# Patient Record
Sex: Male | Born: 1970 | Hispanic: Yes | Marital: Married | Smoking: Current every day smoker
Health system: Southern US, Community
[De-identification: ages and names within clinical notes are randomized; demographics above are authoritative.]

## PROBLEM LIST (undated history)

## (undated) DIAGNOSIS — F909 Attention-deficit hyperactivity disorder, unspecified type: Secondary | ICD-10-CM

## (undated) DIAGNOSIS — Z973 Presence of spectacles and contact lenses: Secondary | ICD-10-CM

## (undated) DIAGNOSIS — I10 Essential (primary) hypertension: Secondary | ICD-10-CM

## (undated) DIAGNOSIS — K219 Gastro-esophageal reflux disease without esophagitis: Secondary | ICD-10-CM

## (undated) HISTORY — DX: Attention-deficit hyperactivity disorder, unspecified type: F90.9

## (undated) HISTORY — DX: Essential (primary) hypertension: I10

## (undated) HISTORY — PX: APPENDECTOMY: SHX54

---

## 2008-11-27 ENCOUNTER — Emergency Department (HOSPITAL_BASED_OUTPATIENT_CLINIC_OR_DEPARTMENT_OTHER): Admission: EM | Admit: 2008-11-27 | Discharge: 2008-11-27 | Payer: Self-pay | Admitting: Emergency Medicine

## 2008-12-02 ENCOUNTER — Inpatient Hospital Stay (HOSPITAL_COMMUNITY): Admission: EM | Admit: 2008-12-02 | Discharge: 2008-12-06 | Payer: Self-pay | Admitting: Internal Medicine

## 2008-12-02 ENCOUNTER — Encounter: Payer: Self-pay | Admitting: Emergency Medicine

## 2008-12-02 ENCOUNTER — Ambulatory Visit: Payer: Self-pay | Admitting: Diagnostic Radiology

## 2009-04-14 ENCOUNTER — Ambulatory Visit: Payer: Self-pay | Admitting: Diagnostic Radiology

## 2009-04-14 ENCOUNTER — Emergency Department (HOSPITAL_BASED_OUTPATIENT_CLINIC_OR_DEPARTMENT_OTHER): Admission: EM | Admit: 2009-04-14 | Discharge: 2009-04-14 | Payer: Self-pay | Admitting: Emergency Medicine

## 2010-02-04 ENCOUNTER — Encounter: Payer: Self-pay | Admitting: Emergency Medicine

## 2010-02-04 ENCOUNTER — Ambulatory Visit: Payer: Self-pay | Admitting: Diagnostic Radiology

## 2010-02-05 ENCOUNTER — Inpatient Hospital Stay (HOSPITAL_COMMUNITY): Admission: EM | Admit: 2010-02-05 | Discharge: 2010-02-09 | Payer: Self-pay

## 2010-06-21 IMAGING — CT CT ABD-PELV W/ CM
2 of 3 series · 16 of 46 positions shown, 18 images · IV contrast (APPLIED)
Comparison: CT 04/14/2009

CLINICAL DATA: Abdominal pain, left lower quadrant pain.  Prior
appendectomy.

CT ABDOMEN AND PELVIS WITH CONTRAST
TECHNIQUE: Multidetector CT imaging of the abdomen and pelvis was
performed following the standard protocol during bolus
administration of intravenous contrast.
Contrast: 100 ml Omnipaque 300

[Series 2: abd/pelvis 5.0 b31f · axial · 0.98mm/px · z∈[-541,-86]mm · 13 of 105 slices shown, 15 images]
[im 7/105  soft-tissue]
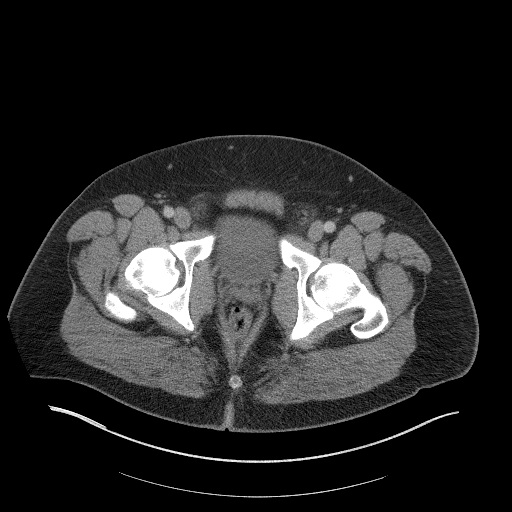
[im 7/105  bone]
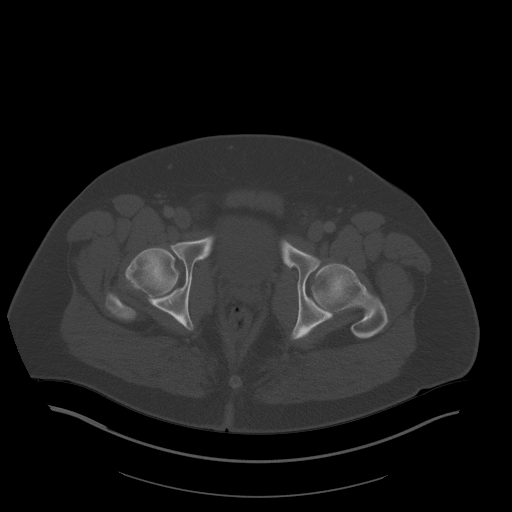
[im 14/105  soft-tissue]
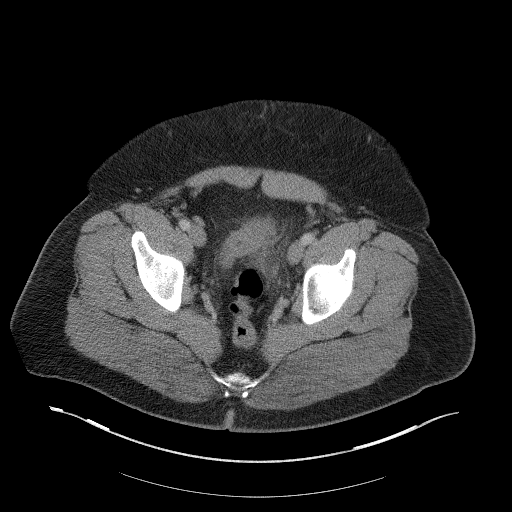
[im 21/105  soft-tissue]
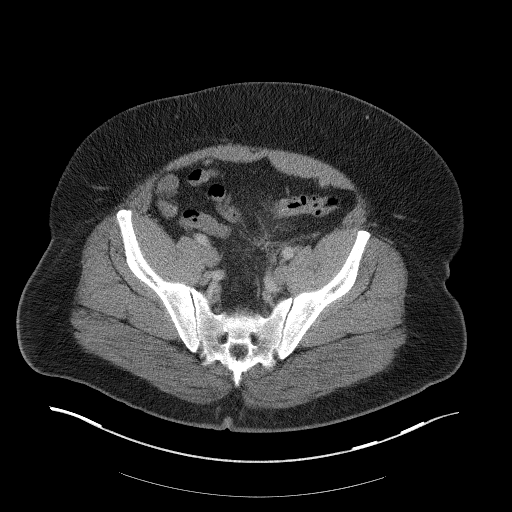
[im 31/105  soft-tissue]
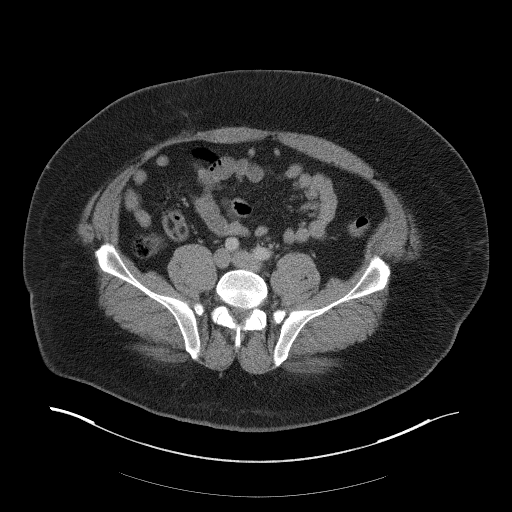
[im 37/105  soft-tissue]
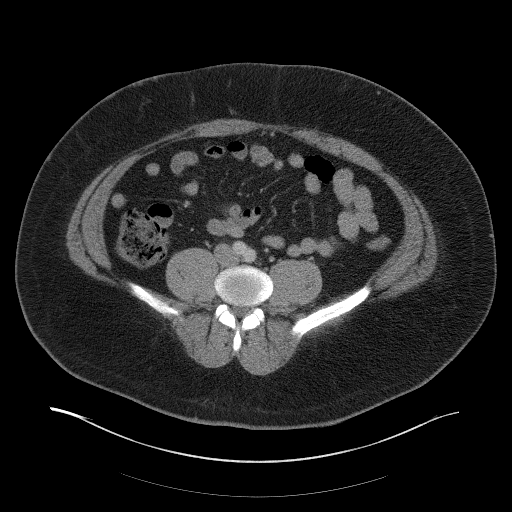
[im 44/105  soft-tissue]
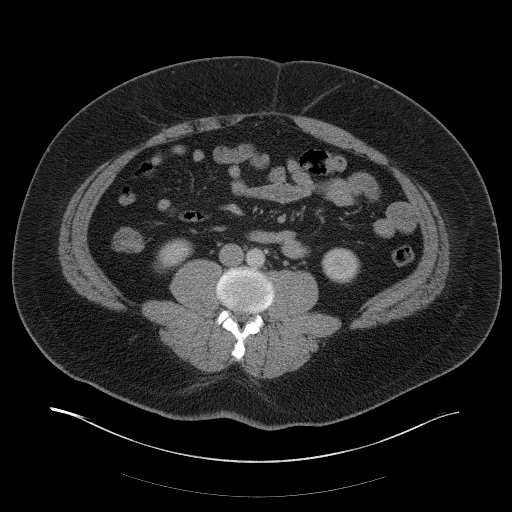
[im 54/105  soft-tissue]
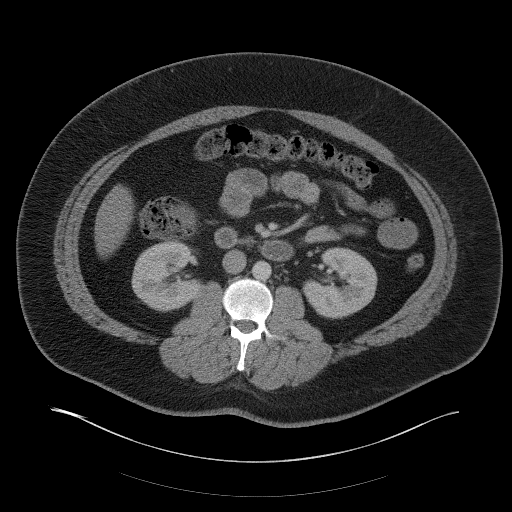
[im 61/105  soft-tissue]
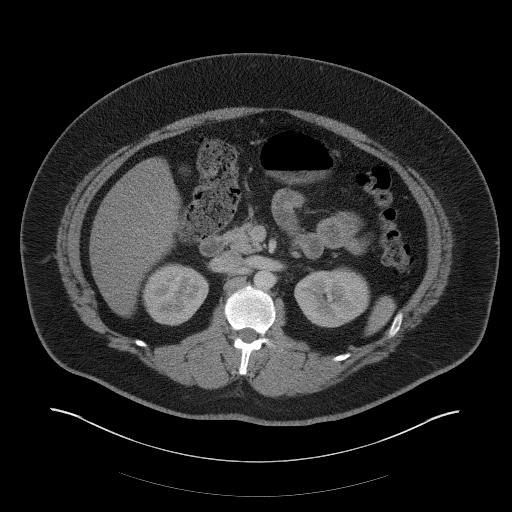
[im 68/105  soft-tissue]
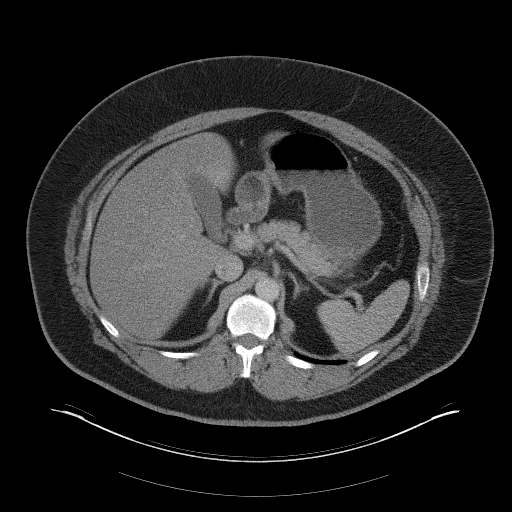
[im 68/105  bone]
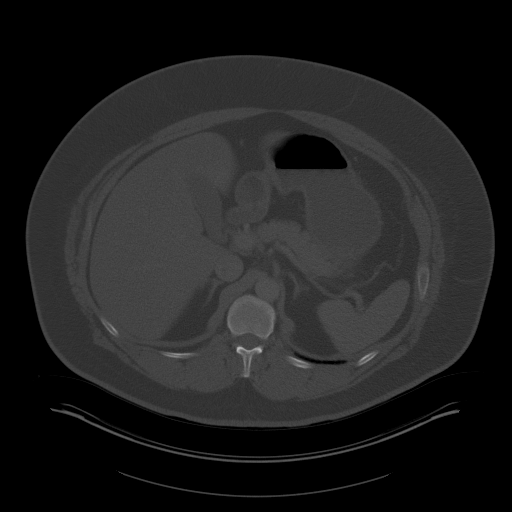
[im 74/105  soft-tissue]
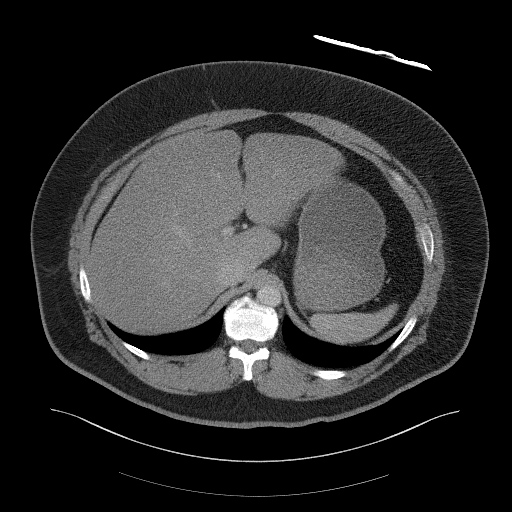
[im 84/105  soft-tissue]
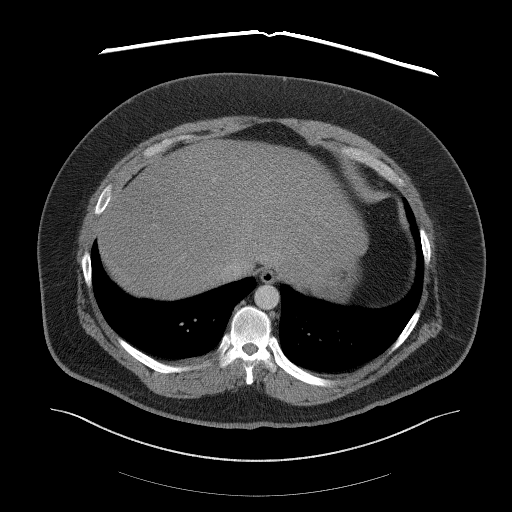
[im 91/105  soft-tissue]
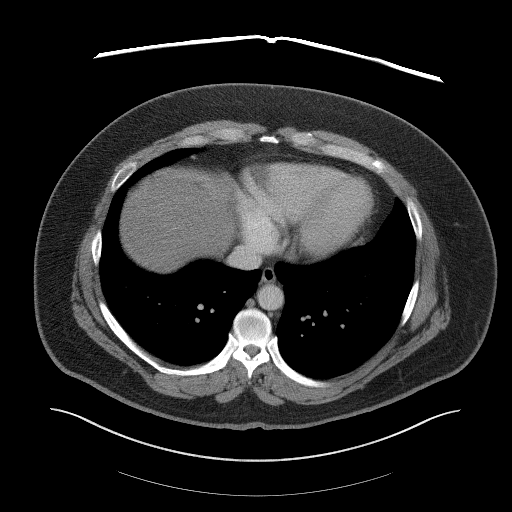
[im 98/105  soft-tissue]
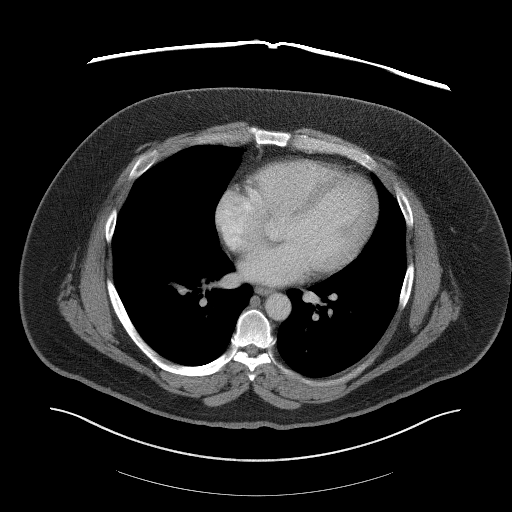

[Series 5: abd/pelvis 3.0 coronal · coronal · 0.95mm/px · 3 of 101 slices shown]
[im 34/101  soft-tissue]
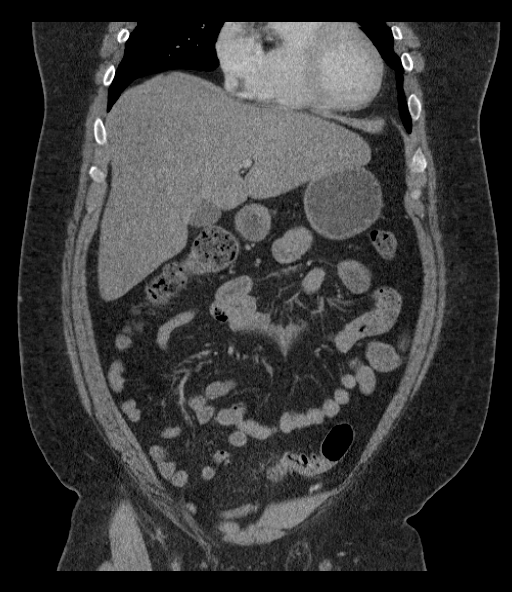
[im 45/101  soft-tissue]
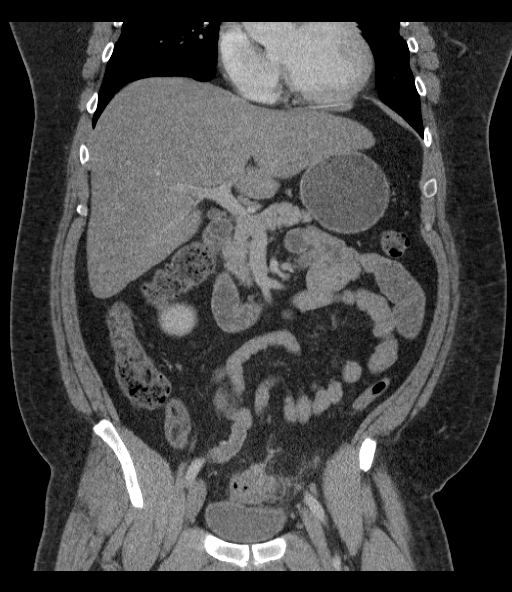
[im 56/101  soft-tissue]
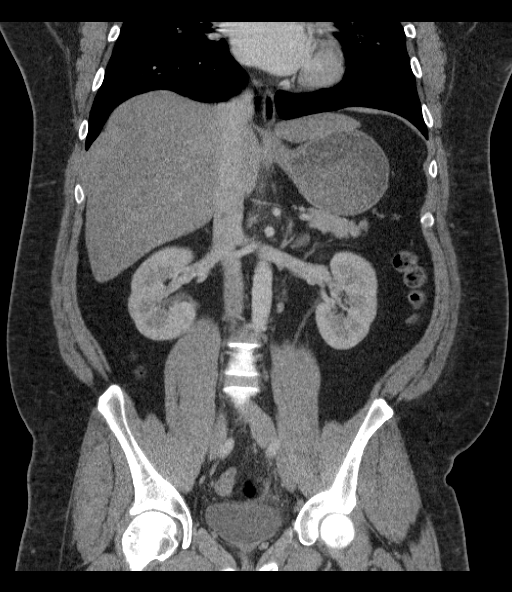

[16 of 46 positions shown; findings below may reference images not displayed]

FINDINGS: There are several small ground-glass nodules within the
right middle lobe which have an infectious or inflammatory pattern.
No pleural fluid.

No focal hepatic lesion.  There is hepatic steatosis.  The
gallbladder, pancreas, spleen, adrenal glands, and kidneys are
normal.

The stomach, duodenum, small bowel, and cecum are normal.  The
ascending and transverse colon are normal.  Beginning in the
proximal sigmoid colon, there is pericolonic fat stranding and a
small amount of fluid in the sigmoid mesocolon.  There are multiple
diverticula through this region and thickened bowel wall (image
89).  There is no evidence of macro perforation or abscess.  The
more distal sigmoid colon and rectum are normal.

Abdominal aorta is normal caliber.  No retroperitoneal
lymphadenopathy.

The bladder and prostate are normal.  No pelvic lymphadenopathy.
There are bilateral 8 mm external iliac lymph nodes present.
IMPRESSION: 1.  Acute sigmoid diverticulitis without evidence of macro
perforation or abscess.
2.  Ground-glass nodules in the right middle lobe are in an
infectious or inflammatory pattern.
3.  Small iliac lymph nodes are likely reactive.

## 2011-02-16 LAB — CBC
HCT: 34.7 % — ABNORMAL LOW (ref 39.0–52.0)
Hemoglobin: 12.9 g/dL — ABNORMAL LOW (ref 13.0–17.0)
Hemoglobin: 12 g/dL — ABNORMAL LOW (ref 13.0–17.0)
Hemoglobin: 12.3 g/dL — ABNORMAL LOW (ref 13.0–17.0)
MCHC: 34.6 g/dL (ref 30.0–36.0)
MCV: 88.5 fL (ref 78.0–100.0)
MCV: 89.4 fL (ref 78.0–100.0)
RBC: 4.19 MIL/uL — ABNORMAL LOW (ref 4.22–5.81)
RBC: 3.97 MIL/uL — ABNORMAL LOW (ref 4.22–5.81)
RDW: 13.4 % (ref 11.5–15.5)
RDW: 14 % (ref 11.5–15.5)
WBC: 10.9 10*3/uL — ABNORMAL HIGH (ref 4.0–10.5)
WBC: 10.1 10*3/uL (ref 4.0–10.5)
WBC: 9.6 10*3/uL (ref 4.0–10.5)

## 2011-02-16 LAB — BASIC METABOLIC PANEL
BUN: 13 mg/dL (ref 6–23)
BUN: 5 mg/dL — ABNORMAL LOW (ref 6–23)
CO2: 26 mEq/L (ref 19–32)
CO2: 27 mEq/L (ref 19–32)
Calcium: 8.3 mg/dL — ABNORMAL LOW (ref 8.4–10.5)
Chloride: 108 mEq/L (ref 96–112)
Chloride: 104 mEq/L (ref 96–112)
Creatinine, Ser: 0.8 mg/dL (ref 0.4–1.5)
Creatinine, Ser: 0.64 mg/dL (ref 0.4–1.5)
GFR calc Af Amer: >60 mL/min (ref >60)
GFR calc Af Amer: >60 mL/min (ref >60)
Glucose, Bld: 109 mg/dL — ABNORMAL HIGH (ref 70–99)
Potassium: 3.8 mEq/L (ref 3.5–5.1)
Sodium: 146 mEq/L — ABNORMAL HIGH (ref 135–145)
Sodium: 133 mEq/L — ABNORMAL LOW (ref 135–145)
Sodium: 135 mEq/L (ref 135–145)

## 2011-02-16 LAB — COMPREHENSIVE METABOLIC PANEL
AST: 21 U/L (ref 0–37)
Albumin: 3.1 g/dL — ABNORMAL LOW (ref 3.5–5.2)
Alkaline Phosphatase: 64 U/L (ref 39–117)
BUN: 9 mg/dL (ref 6–23)
Calcium: 8 mg/dL — ABNORMAL LOW (ref 8.4–10.5)
Creatinine, Ser: 0.72 mg/dL (ref 0.4–1.5)
GFR calc Af Amer: >60 mL/min (ref >60)
Glucose, Bld: 118 mg/dL — ABNORMAL HIGH (ref 70–99)
Total Bilirubin: 1 mg/dL (ref 0.3–1.2)
Total Protein: 6.1 g/dL (ref 6.0–8.3)

## 2011-02-16 LAB — DIFFERENTIAL
Basophils Absolute: 0.1 10*3/uL (ref 0.0–0.1)
Basophils Relative: 1 % (ref 0–1)
Eosinophils Absolute: 0.2 10*3/uL (ref 0.0–0.7)
Eosinophils Relative: 1 % (ref 0–5)
Lymphocytes Relative: 34 % (ref 12–46)
Monocytes Absolute: 1 10*3/uL (ref 0.1–1.0)

## 2011-02-16 LAB — HEMOGLOBIN A1C
Hgb A1c MFr Bld: 6.1 % (ref 4.6–6.1)
Mean Plasma Glucose: 128 mg/dL

## 2011-02-16 LAB — GLUCOSE, CAPILLARY
Glucose-Capillary: 104 mg/dL — ABNORMAL HIGH (ref 70–99)
Glucose-Capillary: 116 mg/dL — ABNORMAL HIGH (ref 70–99)
Glucose-Capillary: 170 mg/dL — ABNORMAL HIGH (ref 70–99)
Glucose-Capillary: 84 mg/dL (ref 70–99)
Glucose-Capillary: 93 mg/dL (ref 70–99)

## 2011-02-16 LAB — URINE CULTURE: Colony Count: 2000

## 2011-03-03 LAB — URINALYSIS, ROUTINE W REFLEX MICROSCOPIC
Bilirubin Urine: NEGATIVE
Glucose, UA: NEGATIVE mg/dL
Hgb urine dipstick: NEGATIVE
Ketones, ur: NEGATIVE mg/dL
Nitrite: NEGATIVE
Protein, ur: NEGATIVE mg/dL
Specific Gravity, Urine: 1.025 (ref 1.005–1.030)
Urobilinogen, UA: 1 mg/dL (ref 0.0–1.0)
pH: 6 (ref 5.0–8.0)

## 2011-03-03 LAB — DIFFERENTIAL
Basophils Absolute: 0.1 10*3/uL (ref 0.0–0.1)
Basophils Relative: 1 % (ref 0–1)
Eosinophils Absolute: 0.1 10*3/uL (ref 0.0–0.7)
Eosinophils Relative: 1 % (ref 0–5)
Lymphocytes Relative: 30 % (ref 12–46)
Lymphs Abs: 2.9 10*3/uL (ref 0.7–4.0)
Monocytes Absolute: 0.5 10*3/uL (ref 0.1–1.0)
Monocytes Relative: 5 % (ref 3–12)
Neutro Abs: 5.9 10*3/uL (ref 1.7–7.7)
Neutrophils Relative %: 63 % (ref 43–77)

## 2011-03-03 LAB — COMPREHENSIVE METABOLIC PANEL
ALT: 24 U/L (ref 0–53)
AST: 23 U/L (ref 0–37)
Albumin: 4.3 g/dL (ref 3.5–5.2)
Alkaline Phosphatase: 111 U/L (ref 39–117)
BUN: 15 mg/dL (ref 6–23)
CO2: 27 mEq/L (ref 19–32)
Calcium: 9.1 mg/dL (ref 8.4–10.5)
Chloride: 103 mEq/L (ref 96–112)
Creatinine, Ser: 0.7 mg/dL (ref 0.4–1.5)
GFR calc Af Amer: >60 mL/min (ref >60)
GFR calc non Af Amer: >60 mL/min (ref >60)
Glucose, Bld: 95 mg/dL (ref 70–99)
Potassium: 4 mEq/L (ref 3.5–5.1)
Sodium: 142 mEq/L (ref 135–145)
Total Bilirubin: 0.8 mg/dL (ref 0.3–1.2)
Total Protein: 8.2 g/dL (ref 6.0–8.3)

## 2011-03-03 LAB — CBC
HCT: 43.9 % (ref 39.0–52.0)
Hemoglobin: 15 g/dL (ref 13.0–17.0)
MCHC: 34.2 g/dL (ref 30.0–36.0)
MCV: 87 fL (ref 78.0–100.0)
Platelets: 219 10*3/uL (ref 150–400)
RBC: 5.05 MIL/uL (ref 4.22–5.81)
RDW: 13.1 % (ref 11.5–15.5)
WBC: 9.5 10*3/uL (ref 4.0–10.5)

## 2011-03-03 LAB — LIPASE, BLOOD: Lipase: 48 U/L (ref 23–300)

## 2011-03-09 LAB — BASIC METABOLIC PANEL
CO2: 25 mEq/L (ref 19–32)
CO2: 27 mEq/L (ref 19–32)
Calcium: 8.2 mg/dL — ABNORMAL LOW (ref 8.4–10.5)
Calcium: 8.6 mg/dL (ref 8.4–10.5)
Chloride: 103 mEq/L (ref 96–112)
Chloride: 105 mEq/L (ref 96–112)
Creatinine, Ser: 0.77 mg/dL (ref 0.4–1.5)
GFR calc Af Amer: >60 mL/min (ref >60)
GFR calc Af Amer: >60 mL/min (ref >60)
GFR calc Af Amer: >60 mL/min (ref >60)
GFR calc non Af Amer: >60 mL/min (ref >60)
Glucose, Bld: 108 mg/dL — ABNORMAL HIGH (ref 70–99)
Glucose, Bld: 125 mg/dL — ABNORMAL HIGH (ref 70–99)
Potassium: 3.8 mEq/L (ref 3.5–5.1)
Sodium: 135 mEq/L (ref 135–145)
Sodium: 137 mEq/L (ref 135–145)
Sodium: 138 mEq/L (ref 135–145)

## 2011-03-09 LAB — LIPID PANEL
Cholesterol: 130 mg/dL (ref 0–200)
HDL: 17 mg/dL — ABNORMAL LOW (ref >39)
Total CHOL/HDL Ratio: 7.6 RATIO
VLDL: 22 mg/dL (ref 0–40)

## 2011-03-09 LAB — URINE MICROSCOPIC-ADD ON

## 2011-03-09 LAB — URINALYSIS, ROUTINE W REFLEX MICROSCOPIC
Ketones, ur: 15 mg/dL — AB
Nitrite: NEGATIVE
Specific Gravity, Urine: 1.034 — ABNORMAL HIGH (ref 1.005–1.030)
Specific Gravity, Urine: 1.035 — ABNORMAL HIGH (ref 1.005–1.030)
Urobilinogen, UA: 1 mg/dL (ref 0.0–1.0)

## 2011-03-09 LAB — CBC
HCT: 35.9 % — ABNORMAL LOW (ref 39.0–52.0)
HCT: 36.7 % — ABNORMAL LOW (ref 39.0–52.0)
HCT: 37.8 % — ABNORMAL LOW (ref 39.0–52.0)
HCT: 39.3 % (ref 39.0–52.0)
Hemoglobin: 12.1 g/dL — ABNORMAL LOW (ref 13.0–17.0)
Hemoglobin: 12.6 g/dL — ABNORMAL LOW (ref 13.0–17.0)
Hemoglobin: 12.8 g/dL — ABNORMAL LOW (ref 13.0–17.0)
Hemoglobin: 12.8 g/dL — ABNORMAL LOW (ref 13.0–17.0)
MCHC: 33.9 g/dL (ref 30.0–36.0)
MCV: 88.6 fL (ref 78.0–100.0)
MCV: 88.6 fL (ref 78.0–100.0)
MCV: 89.1 fL (ref 78.0–100.0)
Platelets: 327 10*3/uL (ref 150–400)
Platelets: 360 10*3/uL (ref 150–400)
RBC: 4.03 MIL/uL — ABNORMAL LOW (ref 4.22–5.81)
RBC: 4.14 MIL/uL — ABNORMAL LOW (ref 4.22–5.81)
RBC: 4.3 MIL/uL (ref 4.22–5.81)
RDW: 13.3 % (ref 11.5–15.5)
RDW: 13.7 % (ref 11.5–15.5)
WBC: 10 10*3/uL (ref 4.0–10.5)
WBC: 13.1 10*3/uL — ABNORMAL HIGH (ref 4.0–10.5)
WBC: 14.4 10*3/uL — ABNORMAL HIGH (ref 4.0–10.5)

## 2011-03-09 LAB — COMPREHENSIVE METABOLIC PANEL
ALT: 38 U/L (ref 0–53)
ALT: 60 U/L — ABNORMAL HIGH (ref 0–53)
Albumin: 3.9 g/dL (ref 3.5–5.2)
BUN: 13 mg/dL (ref 6–23)
BUN: 6 mg/dL (ref 6–23)
CO2: 24 mEq/L (ref 19–32)
CO2: 24 mEq/L (ref 19–32)
CO2: 27 mEq/L (ref 19–32)
Calcium: 7.9 mg/dL — ABNORMAL LOW (ref 8.4–10.5)
Calcium: 8.5 mg/dL (ref 8.4–10.5)
Chloride: 101 mEq/L (ref 96–112)
Chloride: 102 mEq/L (ref 96–112)
Creatinine, Ser: 0.8 mg/dL (ref 0.4–1.5)
GFR calc non Af Amer: >60 mL/min (ref >60)
GFR calc non Af Amer: >60 mL/min (ref >60)
GFR calc non Af Amer: >60 mL/min (ref >60)
Glucose, Bld: 112 mg/dL — ABNORMAL HIGH (ref 70–99)
Glucose, Bld: 116 mg/dL — ABNORMAL HIGH (ref 70–99)
Sodium: 134 mEq/L — ABNORMAL LOW (ref 135–145)
Sodium: 137 mEq/L (ref 135–145)
Total Bilirubin: 0.6 mg/dL (ref 0.3–1.2)
Total Bilirubin: 0.7 mg/dL (ref 0.3–1.2)
Total Protein: 6.9 g/dL (ref 6.0–8.3)

## 2011-03-09 LAB — CULTURE, BLOOD (ROUTINE X 2): Culture: NO GROWTH

## 2011-03-09 LAB — HEMOGLOBIN A1C: Hgb A1c MFr Bld: 6.2 % — ABNORMAL HIGH (ref 4.6–6.1)

## 2011-03-09 LAB — LIPASE, BLOOD: Lipase: 36 U/L (ref 23–300)

## 2011-03-09 LAB — DIFFERENTIAL
Basophils Absolute: 0.1 10*3/uL (ref 0.0–0.1)
Lymphocytes Relative: 10 % — ABNORMAL LOW (ref 12–46)
Neutro Abs: 9.9 10*3/uL — ABNORMAL HIGH (ref 1.7–7.7)
Neutrophils Relative %: 76 % (ref 43–77)

## 2011-03-09 LAB — URINE CULTURE

## 2011-04-07 NOTE — Discharge Summary (Signed)
NAME:  Mitchell Jacobson, TABET NO.:  [id number]   MEDICAL RECORD NO.:  [id number]          PATIENT TYPE:  INP   LOCATION:  1512                         FACILITY:  [facility]   PHYSICIAN:  Hillery Aldo, M.D.   DATE OF BIRTH:  12/10/1970   DATE OF ADMISSION:  12/02/2008  DATE OF DISCHARGE:  12/06/2008                               DISCHARGE SUMMARY   PRIMARY CARE PHYSICIAN:  None.  The patient will be referred to Dr.  Della Goo for hospital follow-up.   DISCHARGE DIAGNOSES:  1. Diverticulitis with microabscess formation.  2. Dehydration.  3. Impaired fasting glucose.  4. Obesity.  5. Fatty liver.   DISCHARGE MEDICATIONS:  1. Cipro 500 mg twice daily times 10 days.  2. Flagyl 500 mg three times daily times 10 days.  3. Percocet 5/325 one to two tabs q.6 h p.r.n. pain.  4. Ibuprofen 800 mg q.8 h p.r.n. pain.  5. Tylenol PM 1 tablet nightly p.r.n.   CONSULTATIONS:  None.   BRIEF ADMISSION HPI:  The patient is a 40 year old male who presented to  the hospital with a chief complaint of colicky type abdominal pain and  intermittent diarrhea.  He presented to the urgent care facility where a  CAT scan was performed and he was found to have diverticulitis with  microabscess formation.  The patient was subsequently referred to the  hospitalist service for direct admission.  For the full details, please  see the dictated report done by Dr. Orvan Falconer.   PROCEDURES AND DIAGNOSTIC STUDIES:  1. CT scan of the abdomen and pelvis on December 02, 2008, showed      moderate sigmoid diverticulitis with several small diverticular      abscesses in the adjacent sigmoid mesocolon.  2. Follow-up CT scan of the abdomen and pelvis on December 05, 2008,      showed no significant change in the sigmoid diverticulitis with      adjacent pericolonic abscess.   DISCHARGE LABORATORY VALUES:  Sodium was 138, potassium 3.9, chloride  105, bicarbonate 25, BUN 8, creatinine 0.76, glucose 108.   White blood  cell count was 8.2, hemoglobin 12.8, hematocrit 37.8, platelets 360.   HOSPITAL COURSE BY PROBLEM:  1. Diverticulitis with microabscess formation:  Patient was admitted      and empirically put on Zosyn and bowel rest.  Dr. Gerrit Friends, of      general surgery, was curb-sided and recommended conservative      therapy with surgical consultation if the patient deteriorates.      The patient's clinical course was one of steady improvement.  His      white blood cell count reached a high of 14.4 and has reverted to      normal.  On December 04, 2008, the patient was converted to Cipro      and Flagyl and has tolerated this therapy well.  He has been      afebrile and his abdominal pain has improved.  A follow-up CT scan      did not show any worsening of his abscesses.  At this point,  the      patient will continue on Cipro and Flagyl for an additional 10 days      of therapy.  A hospital follow-up appointment has been made with      Dr. Lovell Sheehan on December 13, 2008, at 2:15 p.m.  The patient is      instructed to take all of his antibiotics as prescribed and to      return to the emergency department if he experiences any worsening      of abdominal pain, recurrent fever, or hematochezia.  2. Dehydration:  The patient was put on IV fluids and kept on IV      fluids until he was taking adequate p.o.'s.  3. Impaired fasting glucose:  The patient did have impaired fasting      glucose levels.  A hemoglobin A1c was checked and found to be 6.2%,      which corresponds to a mean plasma glucose of 131.  The patient was      advised to make dietary changes and to avoid concentrated sweets      and starch to help with his glycemic control.  4. Obesity/fatty liver:  The patient was advised to eat a heart-      healthy diet.  He should follow up with his primary care physician      for further treatment if necessary.  A fasting lipid profile did      not reveal any evidence of dyslipidemia.   His total cholesterol was      130, HDL 17, LDL 91, and triglycerides 952.   Time spent coordinating care for discharge and discharge instructions  equals 35 minutes.      Hillery Aldo, M.D.  Electronically Signed     CR/MEDQ  D:  12/06/2008  T:  12/06/2008  Job:  [id number]   cc:   Della Goo, M.D.  Fax: [phone]

## 2011-04-07 NOTE — H&P (Signed)
NAME:  Mitchell Jacobson, Mitchell Jacobson NO.:  [id number]   MEDICAL RECORD NO.:  [id number]          PATIENT TYPE:  INP   LOCATION:  1512                         FACILITY:  [facility]   PHYSICIAN:  Vania Rea, M.D. DATE OF BIRTH:  1970/11/24   DATE OF ADMISSION:  12/02/2008  DATE OF DISCHARGE:                              HISTORY & PHYSICAL   PRIMARY CARE PHYSICIAN:  Unassigned.   CHIEF COMPLAINT:  Abdominal pain.   HISTORY OF PRESENT ILLNESS:  This is a 40 year old Hispanic gentleman  who was well until 6 days ago when he started having colicky abdominal  pain and dysuria.  He presented to [facility]  where he was diagnosed with a urinary tract infection and discharged  home with Cipro and Percocet.  The patient says his symptoms improved  somewhat and he was able to discontinue taking Percocet but noticed that  he was having severe colicky abdominal pains with defecation or passing  gas.  Eventually he returned to the urgent care facility yesterday where  a CAT scan was performed and he was found to have diverticulitis with  abscess formation.  The hospitalist service was called to assist with  management.   The patient says fevers have mostly subsided.  However, he continues to  have this severe colicky abdominal pains.  He has been taking Tylenol  and ibuprofen.   He is having no chest pains, no shortness of breath and no dizziness.  He does not describe bloody stool.   PAST MEDICAL HISTORY:  History of appendicitis with postoperative wound  dehiscence.   MEDICATIONS:  Tylenol p.r.n., ibuprofen p.r.n., recently completed a  course of Cipro.   ALLERGIES:  No known drug allergies.   SOCIAL HISTORY:  Smokes 5 cigarettes per day.  Drinks about 3 beers per  month.  Denies illicit drug use.  Works in [employer].   FAMILY HISTORY:  Significant for a father with coronary artery disease,  hypertension and glaucoma.   REVIEW OF SYSTEMS:  Other  than noted above a 10-point review of systems  is unremarkable.   PHYSICAL EXAMINATION:  Obese young Hispanic gentleman lying in bed in no  acute distress.  VITALS:  Temperature is 99.8, pulse 85, respiration 20, blood pressure  120/75.  He is saturating 100% on room air.  His pupils are round and equal.  Mucous membranes pink.  Anicteric.  He  has no cervical lymphadenopathy or thyromegaly.  No jugular venous  distention.  He has a very thick neck.  CHEST:  Clear to auscultation bilaterally.  CARDIOVASCULAR:  Regular rhythm without murmur.  ABDOMEN:  Obese but mostly soft.  He is tender in the left quadrants and  hypogastrium.  EXTREMITIES:  Without edema.  He has 2+ pulses bilaterally.  CENTRAL NERVOUS SYSTEM: Cranial nerves II-XII are grossly intact and has  no focal neurologic deficit.   LABORATORY DATA:  White count is 13.1, hemoglobin 13.1, platelets 127.  Absolute granulocyte count is elevated at 9.9.  His serum chemistry is  significant only for slightly elevated glucose of 137, otherwise his  chemistry is  mostly unremarkable.  AST, ALT and alk phos show mildly  elevated AST of 48, ALT 84 and alk phos 124.  Urinalysis has a specific  gravity is 1.035, total protein is 100, it is otherwise unremarkable.  CT scan of the abdomen shows mild diffuse fatty infiltration of the  liver.  CT scan of the pelvis shows moderate sigmoid diverticulitis with  several small diverticular abscesses in the adjacent sigmoid mesocolon,  the largest being 2-3 cm.   ASSESSMENT:  1. Acute diverticulitis  with abscess formation.  2. Dehydration  3. Hyperglycemia.  4. Obesity.  5. Fatty infiltration of the liver secondary to obesity.  6. Abnormal liver function tests related to fatty liver.   PLAN:  Will admit this gentleman for IV fluids and antibiotics therapy  with Zosyn.  Will avoid NSAIDs at the time being for pain control in  case he needs to proceed to surgery.  Will check a hemoglobin A1c  and  also give advise on diet.  Other plans as per orders.      Vania Rea, M.D.  Electronically Signed     LC/MEDQ  D:  12/02/2008  T:  12/02/2008  Job:  [id number]

## 2011-06-01 ENCOUNTER — Encounter: Payer: Self-pay | Admitting: *Deleted

## 2011-06-01 DIAGNOSIS — J111 Influenza due to unidentified influenza virus with other respiratory manifestations: Secondary | ICD-10-CM | POA: Insufficient documentation

## 2011-06-01 NOTE — ED Notes (Signed)
Per pt wife pt began feeling poorly on 7/4 pt with cough sore throat HA body aches fever and congestion pt was dx with a virus pt presents to the ED with worsening flu like sx

## 2011-06-02 ENCOUNTER — Emergency Department (HOSPITAL_BASED_OUTPATIENT_CLINIC_OR_DEPARTMENT_OTHER)
Admission: EM | Admit: 2011-06-02 | Discharge: 2011-06-02 | Discharge status: Against Medical Advice | Payer: Self-pay | Attending: Emergency Medicine | Admitting: Emergency Medicine

## 2011-06-02 ENCOUNTER — Emergency Department (HOSPITAL_BASED_OUTPATIENT_CLINIC_OR_DEPARTMENT_OTHER): Admit: 2011-06-02 | Payer: Self-pay

## 2011-06-02 HISTORY — DX: Gastro-esophageal reflux disease without esophagitis: K21.9

## 2011-06-02 MED ORDER — ACETAMINOPHEN 325 MG PO TABS: 650.0000 mg | ORAL_TABLET | Freq: Once | ORAL | Status: DC

## 2011-06-02 MED ORDER — ACETAMINOPHEN 325 MG PO TABS
ORAL_TABLET | ORAL | Status: AC
  Filled 2011-06-02: qty 2

## 2011-06-02 NOTE — ED Notes (Signed)
Attempted to administer medication. radiology called pt for x ray and pt was not in the waiting area

## 2011-09-17 ENCOUNTER — Emergency Department (HOSPITAL_COMMUNITY)
Admission: EM | Admit: 2011-09-17 | Discharge: 2011-09-17 | Disposition: A | Discharge status: Home / Self Care | Payer: Self-pay | Attending: Emergency Medicine | Admitting: Emergency Medicine

## 2011-09-17 DIAGNOSIS — IMO0001 Reserved for inherently not codable concepts without codable children: Secondary | ICD-10-CM | POA: Insufficient documentation

## 2011-09-17 DIAGNOSIS — K117 Disturbances of salivary secretion: Secondary | ICD-10-CM | POA: Insufficient documentation

## 2011-09-17 DIAGNOSIS — B9789 Other viral agents as the cause of diseases classified elsewhere: Secondary | ICD-10-CM | POA: Insufficient documentation

## 2011-09-17 DIAGNOSIS — R6883 Chills (without fever): Secondary | ICD-10-CM | POA: Insufficient documentation

## 2011-09-17 DIAGNOSIS — I10 Essential (primary) hypertension: Secondary | ICD-10-CM | POA: Insufficient documentation

## 2011-09-17 DIAGNOSIS — R3919 Other difficulties with micturition: Secondary | ICD-10-CM | POA: Insufficient documentation

## 2011-09-17 LAB — POCT I-STAT, CHEM 8
BUN: 22 mg/dL (ref 6–23)
Calcium, Ion: 1.12 mmol/L (ref 1.12–1.32)
Glucose, Bld: 111 mg/dL — ABNORMAL HIGH (ref 70–99)
HCT: 46 % (ref 39.0–52.0)
TCO2: 24 mmol/L (ref 0–100)

## 2014-09-21 ENCOUNTER — Ambulatory Visit (INDEPENDENT_AMBULATORY_CARE_PROVIDER_SITE_OTHER): Payer: Self-pay | Admitting: Family Medicine

## 2014-09-21 VITALS — HR 72 | Temp 98.7°F | Resp 18 | Ht 71.0 in | Wt 234.0 lb

## 2014-09-21 DIAGNOSIS — S52502D Unspecified fracture of the lower end of left radius, subsequent encounter for closed fracture with routine healing: Secondary | ICD-10-CM

## 2014-09-21 DIAGNOSIS — S022XXD Fracture of nasal bones, subsequent encounter for fracture with routine healing: Secondary | ICD-10-CM

## 2014-09-21 DIAGNOSIS — M79629 Pain in unspecified upper arm: Secondary | ICD-10-CM

## 2014-09-21 DIAGNOSIS — S52511A Displaced fracture of right radial styloid process, initial encounter for closed fracture: Secondary | ICD-10-CM

## 2014-09-21 MED ORDER — OXYCODONE-ACETAMINOPHEN 10-325 MG PO TABS: ORAL_TABLET | ORAL | Status: DC

## 2014-09-21 NOTE — Progress Notes (Signed)
Subjective: 43 year old man who was working in [place] and helping someone out. He was on a scaffold which fell. It was about 8 feet high. He tried to leap from the falling scaffolding, but another layer of it fell on him and trapped him. He fell breaking both arms. The left was in the mid to upper forearm, the right in the more distal forearm and wrist area. They took him to surgery and put casts on both arms. He was told to follow-up with a orthopedist in that area, but he came on back to [place]. He was not given any copies of the x-rays. He was given some Percocet, strength unknown, but is not given him sufficient pain release.  Objective: Both arms are casted. The left hand is more swollen than the right with minimal movement of his fingers. The right has good movement of fingers. He also probably broke his nose. He had a lot of bleeding from both nares. He has an abrasion on the bridge of the nose. Both nares appear open and no blood nail.  Assessment: Bilateral forearm fracture Probable nose fracture Abrasion nose  Plan: Chronic to get records from [facility]  Finally the reports. They sent the x-ray report but not the procedure reports. It looks like a intra-articular fracture of the left distal radius and a radial styloid fracture of the right wrist.  Pain medication Refer to ortho--spoke to [facility] who will need this note before they give him Elevate left arm Ice

## 2014-09-21 NOTE — Patient Instructions (Addendum)
Take ibuprofen 800 mg 3 times daily(200 mg x 4)  Percocet 10 one every 4 hours as needed for severe pain  Return if needed  Elevate as directed and apply ice outside the cast for 5 times daily  Appointment is being made at [facility]ist for you to get this followed up on.

## 2014-09-25 ENCOUNTER — Telehealth: Payer: Self-pay

## 2014-09-25 DIAGNOSIS — S42301A Unspecified fracture of shaft of humerus, right arm, initial encounter for closed fracture: Secondary | ICD-10-CM

## 2014-09-25 DIAGNOSIS — S42302A Unspecified fracture of shaft of humerus, left arm, initial encounter for closed fracture: Secondary | ICD-10-CM

## 2014-09-25 NOTE — Telephone Encounter (Signed)
Pt is wanting to talk with dr hopper to let him know that arm is still not better and would like a referral to ortho

## 2014-09-25 NOTE — Telephone Encounter (Signed)
Per OV referral was to be ordered. Placed referral as this was not completed. Pt advised we are working on this referral.

## 2014-09-28 ENCOUNTER — Ambulatory Visit (INDEPENDENT_AMBULATORY_CARE_PROVIDER_SITE_OTHER): Payer: Self-pay | Admitting: Family Medicine

## 2014-09-28 VITALS — BP 132/86 | HR 62 | Temp 98.4°F | Resp 16 | Ht 69.75 in | Wt 232.8 lb

## 2014-09-28 DIAGNOSIS — J3489 Other specified disorders of nose and nasal sinuses: Secondary | ICD-10-CM

## 2014-09-28 DIAGNOSIS — R220 Localized swelling, mass and lump, head: Secondary | ICD-10-CM

## 2014-09-28 DIAGNOSIS — S52502D Unspecified fracture of the lower end of left radius, subsequent encounter for closed fracture with routine healing: Secondary | ICD-10-CM

## 2014-09-28 DIAGNOSIS — M79629 Pain in unspecified upper arm: Secondary | ICD-10-CM

## 2014-09-28 MED ORDER — OXYCODONE-ACETAMINOPHEN 10-325 MG PO TABS: 1.0000 | ORAL_TABLET | Freq: Four times a day (QID) | ORAL | Status: AC | PRN

## 2014-09-28 NOTE — Progress Notes (Addendum)
Subjective:  Authored by Silas SacramentoJeff Alegandra Sommers, MD - unable to change in [facility].   Patient ID: Mitchell Jacobson, male    DOB: [birth year], [age] y.o.   MRN: [id number]  Chief Complaint  Patient presents with  . Follow-up  . Medication Refill    Percocet  10/325 mg   There are no active problems to display for this patient.  Past Medical History  Diagnosis Date  . GERD (gastroesophageal reflux disease)   . 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 Hypertension    Past Surgical History  Procedure Laterality Date  . Appendectomy     No Known Allergies Prior to Admission medications   Medication Sig Start Date End Date Taking? Authorizing Provider  amphetamine-dextroamphetamine (ADDERALL) 20 MG tablet Take 20 mg by mouth daily.   Yes Historical Provider, MD  atenolol (TENORMIN) 25 MG tablet Take 25 mg by mouth daily.   Yes Historical Provider, MD  lisinopril-hydrochlorothiazide (PRINZIDE,ZESTORETIC) 20-25 MG per tablet Take 1 tablet by mouth daily.   Yes Historical Provider, MD  oxyCODONE-acetaminophen (PERCOCET) 10-325 MG per tablet Take one every 4 hours as needed for severe pain 09/21/14  Yes Peyton Najjaravid H Hopper, MD  omeprazole (PRILOSEC) 10 MG capsule Take 10 mg by mouth daily.      Historical Provider, MD   History   Social History  . Marital Status: Married    Spouse Name: N/A    Number of Children: N/A  . Years of Education: N/A   Occupational History  . Not on file.   Social History Main Topics  . Smoking status: Current Every Day Smoker -- 0.50 packs/day    Types: Cigarettes  . Smokeless tobacco: Not on file  . Alcohol Use: No  . Drug Use: No  . Sexual Activity: Not on file   Other Topics Concern  . Not on file   Social History Narrative   HPI  Mitchell Jacobson is a 43 y.o. male  PCP: No PCP Per Patient  Patient is here for follow up; last seen by Dr. Alwyn RenHopper on 09/21/14 after falling from a scaffold. Noted to have broken bones in both arms. Apparently had  surgery and casts placed on both arms with plan to follow up with orthopedist (scheduled for 11/12, 6 days from today) but initially was in [place]. Per last note, bilateral forearm fracture including an intra-articular fracture of the left distal radius and a radial styloid fracture of the right distal radius. He was prescribed 20 of Percocet and referred to [facility].   HPI Comments:   On 09/17/14, patient fell from a scaffold and sustained bilateral forearm fractures. Patient notes that he has been taking 2-3 percocet a day for pain; this relieves his discomfort. He also takes ibuprofen; 2-3 200mg s at a time.   He also reports pain in his nose; he also notes that blowing his nose results in blood-tinged mucous. He is unsure if he had an xray of the nose at the time of his fall.   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   Review of Systems     Objective:   Physical Exam  Constitutional: He is oriented to person, place, and time. He appears well-developed and well-nourished.  HENT:  Head: Normocephalic and atraumatic.  Nose; nasal swelling, more prominent on the right side of the nasal bridge compared to left; small healing abrasion on distal nasal bridge; no septal hematoma; sinuses are nontender and there is no discharge from the nose; TTP  along right aspect of nasal bone  Neck: No tracheal deviation present.  Cardiovascular: Normal rate.   Pulmonary/Chest: Effort normal.  Musculoskeletal:  Left arm splint is in place; sugar tong splint in place on left arm; fingers are all warm; NVI distally but some soft tissue swelling into the hand   Right wrist; decreased ROM, soft tissue swelling, TTP over ulnar styloid, slight tenderness along distal radius, NVI distally to the fingertips; wearing a nonthumb velcro brace   Neurological: He is alert and oriented to person, place, and time.  Skin: Skin is warm and dry.  Psychiatric: He has a normal mood and affect. His behavior is  normal.  Nursing note and vitals reviewed.   Filed Vitals:   09/28/14 1054  BP: 132/86  Pulse: 62  Temp: 98.4 F (36.9 C)  TempSrc: Oral  Resp: 16  Height: 5' 9.75" (1.772 m)  Weight: 232 lb 12.8 oz (105.597 kg)  SpO2: 100%      Assessment & Plan:   Mitchell Jacobson is a 43 y.o. male Pain of upper arm, unspecified laterality - Plan: oxyCODONE-acetaminophen (PERCOCET) 10-325 MG per tablet, Distal radial fracture, left, closed, with routine healing, subsequent encounter  - by prior notes, L distal radius fracture and R radial styloid fx, but may be ulnar styloid on R based on exam. Ortho eval next week. Cont ibuprofen up to 600mg  Q6h, percocet if needed - SED, #20.  rtc precautions.  Note for work.   Nasal pain, Swelling of nose  - suspected nasal bone fx. Deferred XR today, but if still asymmetric appearance next week - can call and I will refer him to ENT for eval. Understanding expressed.    Meds ordered this encounter  Medications  . oxyCODONE-acetaminophen (PERCOCET) 10-325 MG per tablet    Sig: Take 1 tablet by mouth every 6 (six) hours as needed for pain. Take one every 4 hours as needed for severe pain    Dispense:  20 tablet    Refill:  0   Patient Instructions  Keep follow up with orthopaedist next week and splints on areas until seen there. Avoid lifting or use of these wrists as much as possible until that evaluation. Percocet if needed, but start with ibuprofen 600mg  every 6 hours with food.   If your nose is still sore and swollen next week, especially if it still looks more swollen on one side - call me and I can refer you for evaluation with Ear Nose and Throat specialist.   Return to the clinic or go to the nearest emergency room if any of your symptoms worsen or new symptoms occur.      I personally performed the services described in this documentation, which was scribed in my presence. The recorded information has been reviewed and considered, and addended  by me as needed.

## 2014-09-28 NOTE — Patient Instructions (Signed)
Keep follow up with orthopaedist next week and splints on areas until seen there. Avoid lifting or use of these wrists as much as possible until that evaluation. Percocet if needed, but start with ibuprofen 600mg  every 6 hours with food.   If your nose is still sore and swollen next week, especially if it still looks more swollen on one side - call me and I can refer you for evaluation with Ear Nose and Throat specialist.   Return to the clinic or go to the nearest emergency room if any of your symptoms worsen or new symptoms occur.

## 2014-10-03 NOTE — Telephone Encounter (Signed)
Referral was made.

## 2014-10-04 ENCOUNTER — Encounter (HOSPITAL_BASED_OUTPATIENT_CLINIC_OR_DEPARTMENT_OTHER): Payer: Self-pay | Admitting: *Deleted

## 2014-10-04 ENCOUNTER — Inpatient Hospital Stay (HOSPITAL_BASED_OUTPATIENT_CLINIC_OR_DEPARTMENT_OTHER): Admission: RE | Admit: 2014-10-04 | Payer: Self-pay | Source: Ambulatory Visit

## 2014-10-04 ENCOUNTER — Other Ambulatory Visit: Payer: Self-pay

## 2014-10-04 ENCOUNTER — Encounter (HOSPITAL_BASED_OUTPATIENT_CLINIC_OR_DEPARTMENT_OTHER)
Admission: RE | Admit: 2014-10-04 | Discharge: 2014-10-04 | Disposition: A | Discharge status: Home / Self Care | Payer: Self-pay | Source: Ambulatory Visit | Attending: Orthopedic Surgery | Admitting: Orthopedic Surgery

## 2014-10-04 ENCOUNTER — Other Ambulatory Visit: Payer: Self-pay | Admitting: Orthopedic Surgery

## 2014-10-04 DIAGNOSIS — Z01818 Encounter for other preprocedural examination: Secondary | ICD-10-CM | POA: Insufficient documentation

## 2014-10-04 LAB — BASIC METABOLIC PANEL
ANION GAP: 13 (ref 5–15)
BUN: 14 mg/dL (ref 6–23)
CHLORIDE: 101 meq/L (ref 96–112)
CO2: 25 meq/L (ref 19–32)
Calcium: 9.4 mg/dL (ref 8.4–10.5)
Creatinine, Ser: 0.74 mg/dL (ref 0.50–1.35)
GFR calc non Af Amer: >90 mL/min (ref >90)
Glucose, Bld: 105 mg/dL — ABNORMAL HIGH (ref 70–99)
POTASSIUM: 3.7 meq/L (ref 3.7–5.3)
Sodium: 139 mEq/L (ref 137–147)

## 2014-10-04 NOTE — H&P (Signed)
Mitchell Jacobson is an 43 y.o. male.   CC / Reason for Visit: Bilateral upper extremity injuries HPI: This patient is a 43 year old male who presents for evaluation of injuries involving both hands.  He was helping a friend [place] work in [place], when he fell from scaffolding about 8 feet.  It is my understanding that he had sugar tong splints applied, and was to followup with an orthopedist in that area, Dr. Tery SanfilippoNapoli.  Because he lives in this area, he presented to [facility] who recognized his problem and made referral to the appropriate hand surgeon on-call for the [facility], Dr. Amanda PeaGramig.  Patient reports that he try to schedule a followup appointment with [facility], and was told that he could not be seen because he had no insurance.  Subsequently, the patient called our office and presents today for further evaluation and management.  Past Medical History  Diagnosis Date  . GERD (gastroesophageal reflux disease)   . 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 Hypertension   . Wears glasses     Past Surgical History  Procedure Laterality Date  . Appendectomy      Family History  Problem Relation Age of Onset  . Diabetes Mother   . Heart disease Father   . Hyperlipidemia Father   . Hypertension Father   . Heart disease Paternal Grandmother    Social History:  reports that he has been smoking Cigarettes.  He has been smoking about 0.50 packs per day. He does not have any smokeless tobacco history on file. He reports that he drinks alcohol. He reports that he does not use illicit drugs.  Allergies: No Known Allergies  No prescriptions prior to admission    No results found for this or any previous visit (from the past 48 hour(s)). No results found.  Review of Systems  All other systems reviewed and are negative.   Height 5\' 10"  (1.778 m), weight 105.235 kg (232 lb). Physical Exam  Constitutional:  WD, WN,  NAD HEENT:  NCAT, EOMI Neuro/Psych:  Alert & oriented to person, place, and time; appropriate mood & affect Lymphatic: No generalized UE edema or lymphadenopathy Extremities / MSK:  Both UE are normal with respect to appearance, ranges of motion, joint stability, muscle strength/tone, sensation, & perfusion except as otherwise noted:  Left upper extremity in a sugar tong splint, no ulcerations at the margin.  Digits are warm with brisk capillary refill, no neurological deficits detected.  The right side, there some mild tenderness globally at the dorsum of the wrist  Labs / Xrays:  Complete x-ray series of both right and left wrist ordered and obtained today reveals on the left side, needed displaced distal radius fracture, with intra-articular incongruity and 75% dorsal translation, 40-50 dorsal angulation.  On the right side, may be a small nondisplaced fracture through the base of the ulnar styloid.  Assessment: 1.  Right wrist injury, no further treatment required 2.  Left comminuted displaced intra-articular distal radius fracture  Plan:  At discussed these findings with him and recommended surgical treatment for his right distal radius.  Plastic models were used to describe the problem, incorporating the x-rays as well.  An example of the plate was also demonstrated for him on a saw-bones model.    The details of the operative procedure were discussed with the patient.  Questions were invited and answered.  In addition to the goal of the procedure, the  risks of the procedure to include but not limited to bleeding; infection; damage to the nerves or blood vessels that could result in bleeding, numbness, weakness, chronic pain, and the need for additional procedures; stiffness; the need for revision surgery; and anesthetic risks, were reviewed.  No specific outcome was guaranteed or implied.  Informed consent was obtained.  Pain medicines were also prescribed.  Kree Rafter A. 10/04/2014,  2:58 PM

## 2014-10-04 NOTE — Progress Notes (Signed)
Pt coming in for ekg-bmet-fx both wrists

## 2014-10-05 ENCOUNTER — Ambulatory Visit (HOSPITAL_BASED_OUTPATIENT_CLINIC_OR_DEPARTMENT_OTHER)
Admission: RE | Admit: 2014-10-05 | Discharge: 2014-10-05 | Disposition: A | Discharge status: Home / Self Care | Payer: Self-pay | Source: Ambulatory Visit | Attending: Orthopedic Surgery | Admitting: Orthopedic Surgery

## 2014-10-05 ENCOUNTER — Encounter (HOSPITAL_BASED_OUTPATIENT_CLINIC_OR_DEPARTMENT_OTHER)
Admission: RE | Disposition: A | Discharge status: Home / Self Care | Payer: Self-pay | Source: Ambulatory Visit | Attending: Orthopedic Surgery

## 2014-10-05 ENCOUNTER — Ambulatory Visit (HOSPITAL_BASED_OUTPATIENT_CLINIC_OR_DEPARTMENT_OTHER): Payer: Self-pay | Admitting: Anesthesiology

## 2014-10-05 ENCOUNTER — Ambulatory Visit (HOSPITAL_COMMUNITY): Payer: Self-pay

## 2014-10-05 ENCOUNTER — Encounter (HOSPITAL_BASED_OUTPATIENT_CLINIC_OR_DEPARTMENT_OTHER): Payer: Self-pay | Admitting: *Deleted

## 2014-10-05 DIAGNOSIS — Y9389 Activity, other specified: Secondary | ICD-10-CM | POA: Insufficient documentation

## 2014-10-05 DIAGNOSIS — I10 Essential (primary) hypertension: Secondary | ICD-10-CM | POA: Insufficient documentation

## 2014-10-05 DIAGNOSIS — F909 Attention-deficit hyperactivity disorder, unspecified type: Secondary | ICD-10-CM | POA: Insufficient documentation

## 2014-10-05 DIAGNOSIS — Y9289 Other specified places as the place of occurrence of the external cause: Secondary | ICD-10-CM | POA: Insufficient documentation

## 2014-10-05 DIAGNOSIS — W12XXXA Fall on and from scaffolding, initial encounter: Secondary | ICD-10-CM | POA: Insufficient documentation

## 2014-10-05 DIAGNOSIS — Z419 Encounter for procedure for purposes other than remedying health state, unspecified: Secondary | ICD-10-CM

## 2014-10-05 DIAGNOSIS — S52572A Other intraarticular fracture of lower end of left radius, initial encounter for closed fracture: Secondary | ICD-10-CM | POA: Insufficient documentation

## 2014-10-05 DIAGNOSIS — F17210 Nicotine dependence, cigarettes, uncomplicated: Secondary | ICD-10-CM | POA: Insufficient documentation

## 2014-10-05 DIAGNOSIS — K219 Gastro-esophageal reflux disease without esophagitis: Secondary | ICD-10-CM | POA: Insufficient documentation

## 2014-10-05 DIAGNOSIS — Y998 Other external cause status: Secondary | ICD-10-CM | POA: Insufficient documentation

## 2014-10-05 HISTORY — PX: OPEN REDUCTION INTERNAL FIXATION (ORIF) DISTAL RADIAL FRACTURE: SHX5989

## 2014-10-05 HISTORY — DX: Presence of spectacles and contact lenses: Z97.3

## 2014-10-05 LAB — POCT HEMOGLOBIN-HEMACUE: Hemoglobin: 14.6 g/dL (ref 13.0–17.0)

## 2014-10-05 SURGERY — OPEN REDUCTION INTERNAL FIXATION (ORIF) DISTAL RADIUS FRACTURE
Anesthesia: Regional | Site: Wrist | Laterality: Left

## 2014-10-05 MED ORDER — OXYCODONE HCL 5 MG/5ML PO SOLN: 5.0000 mg | Freq: Once | ORAL | Status: DC | PRN

## 2014-10-05 MED ORDER — LACTATED RINGERS IV SOLN
INTRAVENOUS | Status: DC | PRN
  Administered 2014-10-05 (×2): via INTRAVENOUS

## 2014-10-05 MED ORDER — PROPOFOL 10 MG/ML IV BOLUS
INTRAVENOUS | Status: AC
  Filled 2014-10-05: qty 20

## 2014-10-05 MED ORDER — MIDAZOLAM HCL 2 MG/2ML IJ SOLN
INTRAMUSCULAR | Status: AC
  Filled 2014-10-05: qty 2

## 2014-10-05 MED ORDER — MIDAZOLAM HCL 2 MG/2ML IJ SOLN
1.0000 mg | INTRAMUSCULAR | Status: DC | PRN
  Administered 2014-10-05: 2 mg via INTRAVENOUS

## 2014-10-05 MED ORDER — CLINDAMYCIN PHOSPHATE 900 MG/50ML IV SOLN
INTRAVENOUS | Status: AC
  Filled 2014-10-05: qty 50

## 2014-10-05 MED ORDER — PROPOFOL 10 MG/ML IV BOLUS
INTRAVENOUS | Status: DC | PRN
  Administered 2014-10-05: 200 mg via INTRAVENOUS

## 2014-10-05 MED ORDER — BUPIVACAINE-EPINEPHRINE (PF) 0.5% -1:200000 IJ SOLN
INTRAMUSCULAR | Status: AC
  Filled 2014-10-05: qty 30

## 2014-10-05 MED ORDER — FENTANYL CITRATE 0.05 MG/ML IJ SOLN
INTRAMUSCULAR | Status: AC
  Filled 2014-10-05: qty 4

## 2014-10-05 MED ORDER — GLYCOPYRROLATE 0.2 MG/ML IJ SOLN
INTRAMUSCULAR | Status: DC | PRN
  Administered 2014-10-05: 0.2 mg via INTRAVENOUS

## 2014-10-05 MED ORDER — CLINDAMYCIN PHOSPHATE 900 MG/50ML IV SOLN
900.0000 mg | INTRAVENOUS | Status: AC
  Administered 2014-10-05: 900 mg via INTRAVENOUS

## 2014-10-05 MED ORDER — ONDANSETRON HCL 4 MG/2ML IJ SOLN
INTRAMUSCULAR | Status: DC | PRN
  Administered 2014-10-05: 4 mg via INTRAVENOUS

## 2014-10-05 MED ORDER — BUPIVACAINE-EPINEPHRINE (PF) 0.5% -1:200000 IJ SOLN
INTRAMUSCULAR | Status: DC | PRN
  Administered 2014-10-05: 30 mL via PERINEURAL

## 2014-10-05 MED ORDER — MIDAZOLAM HCL 5 MG/5ML IJ SOLN
INTRAMUSCULAR | Status: DC | PRN
  Administered 2014-10-05: 2 mg via INTRAVENOUS

## 2014-10-05 MED ORDER — FENTANYL CITRATE 0.05 MG/ML IJ SOLN
50.0000 ug | INTRAMUSCULAR | Status: DC | PRN
  Administered 2014-10-05: 100 ug via INTRAVENOUS

## 2014-10-05 MED ORDER — CLINDAMYCIN PHOSPHATE 900 MG/50ML IV SOLN
INTRAVENOUS | Status: DC | PRN
  Administered 2014-10-05: 900 mg via INTRAVENOUS

## 2014-10-05 MED ORDER — LACTATED RINGERS IV SOLN: INTRAVENOUS | Status: DC

## 2014-10-05 MED ORDER — HYDROMORPHONE HCL 1 MG/ML IJ SOLN: 0.2500 mg | INTRAMUSCULAR | Status: DC | PRN

## 2014-10-05 MED ORDER — OXYCODONE HCL 5 MG PO TABS: 5.0000 mg | ORAL_TABLET | Freq: Once | ORAL | Status: DC | PRN

## 2014-10-05 MED ORDER — LACTATED RINGERS IV SOLN
INTRAVENOUS | Status: DC
  Administered 2014-10-05: 14:00:00 via INTRAVENOUS

## 2014-10-05 MED ORDER — FENTANYL CITRATE 0.05 MG/ML IJ SOLN
INTRAMUSCULAR | Status: AC
  Filled 2014-10-05: qty 2

## 2014-10-05 MED ORDER — FENTANYL CITRATE 0.05 MG/ML IJ SOLN
INTRAMUSCULAR | Status: DC | PRN
  Administered 2014-10-05 (×2): 25 ug via INTRAVENOUS

## 2014-10-05 MED ORDER — DEXAMETHASONE SODIUM PHOSPHATE 4 MG/ML IJ SOLN
INTRAMUSCULAR | Status: DC | PRN
  Administered 2014-10-05: 10 mg via INTRAVENOUS

## 2014-10-05 SURGICAL SUPPLY — 63 items
BANDAGE COBAN STERILE 2 (GAUZE/BANDAGES/DRESSINGS) IMPLANT
BLADE MINI RND TIP GREEN BEAV (BLADE) IMPLANT
BLADE SURG 15 STRL LF DISP TIS (BLADE) ×1 IMPLANT
BLADE SURG 15 STRL SS (BLADE) ×2
BNDG COHESIVE 4X5 TAN STRL (GAUZE/BANDAGES/DRESSINGS) ×3 IMPLANT
BNDG ESMARK 4X9 LF (GAUZE/BANDAGES/DRESSINGS) ×3 IMPLANT
BNDG GAUZE ELAST 4 BULKY (GAUZE/BANDAGES/DRESSINGS) ×6 IMPLANT
BRUSH SCRUB EZ PLAIN DRY (MISCELLANEOUS) ×3 IMPLANT
CANISTER SUCT 1200ML W/VALVE (MISCELLANEOUS) IMPLANT
CHLORAPREP W/TINT 26ML (MISCELLANEOUS) ×3 IMPLANT
CORDS BIPOLAR (ELECTRODE) ×3 IMPLANT
COVER BACK TABLE 60X90IN (DRAPES) ×3 IMPLANT
COVER MAYO STAND STRL (DRAPES) ×3 IMPLANT
CUFF TOURNIQUET SINGLE 18IN (TOURNIQUET CUFF) ×3 IMPLANT
CUFF TOURNIQUET SINGLE 24IN (TOURNIQUET CUFF) IMPLANT
DRAPE C-ARM 42X72 X-RAY (DRAPES) ×3 IMPLANT
DRAPE EXTREMITY T 121X128X90 (DRAPE) ×3 IMPLANT
DRAPE SURG 17X23 STRL (DRAPES) ×3 IMPLANT
DRILL, SOLID SIDE CUTTING, 2.0MM X 40MM ×2 IMPLANT
DRILL, SOLID SIDE CUTTING, 2.5MM X 40MM ×3 IMPLANT
DRSG ADAPTIC 3X8 NADH LF (GAUZE/BANDAGES/DRESSINGS) ×3 IMPLANT
DRSG EMULSION OIL 3X3 NADH (GAUZE/BANDAGES/DRESSINGS) IMPLANT
ELECT REM PT RETURN 9FT ADLT (ELECTROSURGICAL) ×3
ELECTRODE REM PT RTRN 9FT ADLT (ELECTROSURGICAL) ×1 IMPLANT
GAUZE SPONGE 4X4 12PLY STRL (GAUZE/BANDAGES/DRESSINGS) IMPLANT
GLOVE BIO SURGEON STRL SZ7.5 (GLOVE) ×3 IMPLANT
GLOVE BIOGEL PI IND STRL 7.0 (GLOVE) ×1 IMPLANT
GLOVE BIOGEL PI IND STRL 8 (GLOVE) ×1 IMPLANT
GLOVE BIOGEL PI INDICATOR 7.0 (GLOVE) ×2
GLOVE BIOGEL PI INDICATOR 8 (GLOVE) ×2
GLOVE ECLIPSE 6.5 STRL STRAW (GLOVE) ×6 IMPLANT
GOWN STRL REUS W/ TWL LRG LVL3 (GOWN DISPOSABLE) ×2 IMPLANT
GOWN STRL REUS W/TWL LRG LVL3 (GOWN DISPOSABLE) ×4
GOWN STRL REUS W/TWL XL LVL3 (GOWN DISPOSABLE) ×3 IMPLANT
GUIDE AIMING 1.5MM (WIRE) ×6 IMPLANT
KWIRE, STANDARD TIP 1.5MMX127MM ×6 IMPLANT
NEEDLE HYPO 25X1 1.5 SAFETY (NEEDLE) IMPLANT
NS IRRIG 1000ML POUR BTL (IV SOLUTION) ×3 IMPLANT
PACK BASIN DAY SURGERY FS (CUSTOM PROCEDURE TRAY) ×3 IMPLANT
PADDING CAST ABS 4INX4YD NS (CAST SUPPLIES) ×2
PADDING CAST ABS COTTON 4X4 ST (CAST SUPPLIES) ×1 IMPLANT
PENCIL BUTTON HOLSTER BLD 10FT (ELECTRODE) ×3 IMPLANT
RUBBERBAND STERILE (MISCELLANEOUS) IMPLANT
SKELETAL DYNAMICS DVR SET (Set) ×3 IMPLANT
SLEEVE SCD COMPRESS KNEE MED (MISCELLANEOUS) IMPLANT
SLING ARM FOAM STRAP LRG (SOFTGOODS) ×3 IMPLANT
SPLINT PLASTER CAST XFAST 3X15 (CAST SUPPLIES) ×10 IMPLANT
SPLINT PLASTER XTRA FASTSET 3X (CAST SUPPLIES) ×20
SPONGE GAUZE 4X4 12PLY STER LF (GAUZE/BANDAGES/DRESSINGS) ×6 IMPLANT
STOCKINETTE 6  STRL (DRAPES) ×2
STOCKINETTE 6 STRL (DRAPES) ×1 IMPLANT
SUCTION FRAZIER TIP 10 FR DISP (SUCTIONS) IMPLANT
SUT VIC AB 2-0 PS2 27 (SUTURE) ×3 IMPLANT
SUT VICRYL 4-0 PS2 18IN ABS (SUTURE) IMPLANT
SUT VICRYL RAPIDE 4-0 (SUTURE) IMPLANT
SUT VICRYL RAPIDE 4/0 PS 2 (SUTURE) ×3 IMPLANT
SYR BULB 3OZ (MISCELLANEOUS) ×3 IMPLANT
SYRINGE 10CC LL (SYRINGE) IMPLANT
TOWEL OR 17X24 6PK STRL BLUE (TOWEL DISPOSABLE) ×6 IMPLANT
TOWEL OR NON WOVEN STRL DISP B (DISPOSABLE) ×3 IMPLANT
TUBE CONNECTING 20'X1/4 (TUBING)
TUBE CONNECTING 20X1/4 (TUBING) IMPLANT
UNDERPAD 30X30 INCONTINENT (UNDERPADS AND DIAPERS) ×3 IMPLANT

## 2014-10-05 NOTE — Interval H&P Note (Signed)
History and Physical Interval Note:  10/05/2014 1:36 PM  Mitchell Jacobson  has presented today for surgery, with the diagnosis of displaced left distal radius fracture  The various methods of treatment have been discussed with the patient and family. After consideration of risks, benefits and other options for treatment, the patient has consented to  Procedure(s): OPEN REDUCTION INTERNAL FIXATION (ORIF) LEFT DISTAL RADIAL FRACTURE (Left) as a surgical intervention .  The patient's history has been reviewed, patient examined, no change in status, stable for surgery.  I have reviewed the patient's chart and labs.  Questions were answered to the patient's satisfaction.     Ivo Moga A.

## 2014-10-05 NOTE — Anesthesia Postprocedure Evaluation (Signed)
  Anesthesia Post-op Note  Patient: Mitchell Jacobson  Procedure(s) Performed: Procedure(s): OPEN REDUCTION INTERNAL FIXATION (ORIF) LEFT DISTAL RADIAL FRACTURE (Left)  Patient Location: PACU  Anesthesia Type: General, Regional   Level of Consciousness: awake, alert  and oriented  Airway and Oxygen Therapy: Patient Spontanous Breathing  Post-op Pain: mild  Post-op Assessment: Post-op Vital signs reviewed  Post-op Vital Signs: Reviewed  Last Vitals:  Filed Vitals:   10/05/14 1600  BP:   Pulse: 51  Temp:   Resp: 14    Complications: No apparent anesthesia complications

## 2014-10-05 NOTE — Anesthesia Procedure Notes (Addendum)
Anesthesia Regional Block:  Supraclavicular block  Pre-Anesthetic Checklist: ,, timeout performed, Correct Patient, Correct Site, Correct Laterality, Correct Procedure, Correct Position, site marked, Risks and benefits discussed, pre-op evaluation, post-op pain management  Laterality: Left  Prep: Maximum Sterile Barrier Precautions used and chloraprep       Needles:  Injection technique: Single-shot  Needle Type: Echogenic Stimulator Needle     Needle Length: 5cm 5 cm Needle Gauge: 22 and 22 G    Additional Needles:  Procedures: ultrasound guided (picture in chart) Supraclavicular block Narrative:  Start time: 10/05/2014 1:56 PM End time: 10/05/2014 2:04 PM Injection made incrementally with aspirations every 5 mL.  Additional Notes: 2% Lidocaine skin wheel.    Procedure Name: LMA Insertion Date/Time: 10/05/2014 2:18 PM Performed by: Gar GibbonKEETON, Deniro Laymon S Pre-anesthesia Checklist: Patient identified, Emergency Drugs available, Suction available and Patient being monitored Patient Re-evaluated:Patient Re-evaluated prior to inductionOxygen Delivery Method: Circle System Utilized Preoxygenation: Pre-oxygenation with 100% oxygen Intubation Type: IV induction Ventilation: Mask ventilation without difficulty LMA: LMA with gastric port inserted LMA Size: 5.0 Number of attempts: 1 Placement Confirmation: positive ETCO2 Tube secured with: Tape Dental Injury: Teeth and Oropharynx as per pre-operative assessment

## 2014-10-05 NOTE — Anesthesia Preprocedure Evaluation (Addendum)
Anesthesia Evaluation  Patient identified by MRN, date of birth, ID band Patient awake    Reviewed: Allergy & Precautions, H&P , NPO status , Patient's Chart, lab work & pertinent test results, reviewed documented beta blocker date and time   Airway Mallampati: II  TM Distance: >3 FB Neck ROM: Full    Dental no notable dental hx. (+) Teeth Intact, Dental Advisory Given   Pulmonary Current Smoker,  breath sounds clear to auscultation  Pulmonary exam normal       Cardiovascular hypertension, Pt. on medications and Pt. on home beta blockers Rhythm:Regular Rate:Normal     Neuro/Psych negative neurological ROS  negative psych ROS   GI/Hepatic Neg liver ROS, GERD-  ,  Endo/Other  negative endocrine ROS  Renal/GU negative Renal ROS  negative genitourinary   Musculoskeletal   Abdominal   Peds  (+) ADHD Hematology negative hematology ROS (+)   Anesthesia Other Findings   Reproductive/Obstetrics negative OB ROS                            Anesthesia Physical Anesthesia Plan  ASA: II  Anesthesia Plan: General and Regional   Post-op Pain Management:    Induction: Intravenous  Airway Management Planned: LMA  Additional Equipment:   Intra-op Plan:   Post-operative Plan: Extubation in OR  Informed Consent: I have reviewed the patients History and Physical, chart, labs and discussed the procedure including the risks, benefits and alternatives for the proposed anesthesia with the patient or authorized representative who has indicated his/her understanding and acceptance.   Dental advisory given  Plan Discussed with: CRNA  Anesthesia Plan Comments:         Anesthesia Quick Evaluation

## 2014-10-05 NOTE — Discharge Instructions (Addendum)
Discharge Instructions ° ° °You have a dressing with a plaster splint incorporated in it. °Move your fingers as much as possible, making a full fist and fully opening the fist. °Elevate your hand to reduce pain & swelling of the digits.  Ice over the operative site may be helpful to reduce pain & swelling.  DO NOT USE HEAT. °Pain medicine has been prescribed for you.  °Use your medicine as needed over the first 48 hours, and then you can begin to taper your use.  You may use Tylenol in place of your prescribed pain medication, but not IN ADDITION to it. °Leave the dressing in place until you return to our office.  °You may shower, but keep the bandage clean & dry.  °You may drive a car when you are off of prescription pain medications and can safely control your vehicle with both hands. ° ° °Please call [phone] during normal business hours or [phone] after hours for any problems. Including the following: ° °- excessive redness of the incisions °- drainage for more than 4 days °- fever of more than 101.5 F ° °*Please note that pain medications will not be refilled after hours or on weekends. ° ° °Post Anesthesia Home Care Instructions ° °Activity: °Get plenty of rest for the remainder of the day. A responsible adult should stay with you for 24 hours following the procedure.  °For the next 24 hours, DO NOT: °-Drive a car °-Operate machinery °-Drink alcoholic beverages °-Take any medication unless instructed by your physician °-Make any legal decisions or sign important papers. ° °Meals: °Start with liquid foods such as gelatin or soup. Progress to regular foods as tolerated. Avoid greasy, spicy, heavy foods. If nausea and/or vomiting occur, drink only clear liquids until the nausea and/or vomiting subsides. Call your physician if vomiting continues. ° °Special Instructions/Symptoms: °Your throat may feel dry or sore from the anesthesia or the breathing tube placed in your throat during surgery. If this  causes discomfort, gargle with warm salt water. The discomfort should disappear within 24 hours. ° °Regional Anesthesia Blocks ° °1. Numbness or the inability to move the "blocked" extremity may last from 3-48 hours after placement. The length of time depends on the medication injected and your individual response to the medication. If the numbness is not going away after 48 hours, call your surgeon. ° °2. The extremity that is blocked will need to be protected until the numbness is gone and the  Strength has returned. Because you cannot feel it, you will need to take extra care to avoid injury. Because it may be weak, you may have difficulty moving it or using it. You may not know what position it is in without looking at it while the block is in effect. ° °3. For blocks in the legs and feet, returning to weight bearing and walking needs to be done carefully. You will need to wait until the numbness is entirely gone and the strength has returned. You should be able to move your leg and foot normally before you try and bear weight or walk. You will need someone to be with you when you first try to ensure you do not fall and possibly risk injury. ° °4. Bruising and tenderness at the needle site are common side effects and will resolve in a few days. ° °5. Persistent numbness or new problems with movement should be communicated to the surgeon or the [facility] ([phone])/ [facility] ([phone]). ° °

## 2014-10-05 NOTE — Transfer of Care (Signed)
Immediate Anesthesia Transfer of Care Note  Patient: Mitchell Jacobson  Procedure(s) Performed: Procedure(s): OPEN REDUCTION INTERNAL FIXATION (ORIF) LEFT DISTAL RADIAL FRACTURE (Left)  Patient Location: PACU  Anesthesia Type:General and Regional  Level of Consciousness: sedated  Airway & Oxygen Therapy: Patient Spontanous Breathing and Patient connected to face mask oxygen  Post-op Assessment: Report given to PACU RN and Post -op Vital signs reviewed and stable  Post vital signs: Reviewed and stable  Complications: No apparent anesthesia complications

## 2014-10-05 NOTE — Op Note (Addendum)
10/05/2014  1:36 PM  PATIENT:  Mitchell Jacobson C [place]  43 y.o. male  PRE-OPERATIVE DIAGNOSIS:  Comminuted displaced intra-articular distal radius fracture  POST-OPERATIVE DIAGNOSIS:  Same  PROCEDURE:  ORIF comminuted displaced distal radius fracture  SURGEON: Cliffton Astersavid A. Janee Mornhompson, MD  PHYSICIAN ASSISTANT: Danielle RankinKirsten Schrader, OPA-C  ANESTHESIA:  regional and general  SPECIMENS:  None  DRAINS:   None  PREOPERATIVE INDICATIONS:  Mitchell Jacobson C [place] is a  43 y.o. male with comminuted displaced intra-articular distal radius fracture  The risks benefits and alternatives were discussed with the patient preoperatively including but not limited to the risks of infection, bleeding, nerve injury, cardiopulmonary complications, the need for revision surgery, among others, and the patient verbalized understanding and consented to proceed.  OPERATIVE IMPLANTS: [employer] distal radius plate/screws  OPERATIVE PROCEDURE: After receiving prophylactic antibiotics and a regional block, the patient was escorted to the operative theatre and placed in a supine position. General anesthesia was administered.  A surgical "time-out" was performed during which the planned procedure, proposed operative site, and the correct patient identity were compared to the operative consent and agreement confirmed by the circulating nurse according to current facility policy. Following application of a tourniquet to the operative extremity, the exposed skin was pre-scrub with Hibiclens scrub brush and then was prepped with Chloraprep and draped in the usual sterile fashion. The limb was exsanguinated with an Esmarch bandage and the tourniquet inflated to approximately 100mmHg higher than systolic BP.   A sinusoidal-shaped incision was marked and made over the FCR axis and the distal forearm. The skin was incised sharply with scalpel, subcutaneous tissues with blunt and spreading dissection. The FCR axis was exploited deeply. The  pronator quadratus was reflected in an L-shaped ulnarly and the brachioradialis was split in a Z-plasty fashion for later reapproximation. The fracture was inspected and provisionally reduced.  This was confirmed fluoroscopically. The appropriately sized plate was selected and found to fit well. It was placed in its provisional alignment of the radius and this was confirmed fluoroscopically.  It was secured to the radius with a screw through the slotted hole.  Additional adjustments were made as necessary, and the distal holes were all drilled and filled.  Peg/screw length distally was selected on the shorter side of measurements to minimize the risk for dorsal cortical penetration. The remainder of the proximal holes were drilled and filled.   Final images were obtained and the DRUJ was examined for stability. It was found to be sufficiently stable. The wound was then copiously irrigated and the brachioradialis repaired with 2-0 Vicryl Rapide suture followed by repair of the pronator quadratus with the same suture type. Tourniquet was released and additional hemostasis obtained and the skin was closed with 2-0 Vicryl deep dermal buried sutures followed by running 4-0 Vicryl Rapide horizontal mattress suture in the skin. A bulky dressing with a volar plaster component was applied and she was taken to room stable condition.  DISPOSITION: The patient will be discharged home today with typical post-op instructions, returning in 10-15 days for reevaluation with new x-rays of the affected wrist out of the splint to include an inclined lateral and then transition to a black velcro wrist splint.

## 2014-10-08 ENCOUNTER — Encounter (HOSPITAL_BASED_OUTPATIENT_CLINIC_OR_DEPARTMENT_OTHER): Payer: Self-pay | Admitting: Orthopedic Surgery

## 2015-02-19 IMAGING — RF DG C-ARM 1-60 MIN-NO REPORT
1 series · 3 of 3 positions shown · non-contrast
Comparison: None.

CLINICAL DATA: Distal radius fracture ORIF.

EXAM:
LEFT WRIST - COMPLETE 3+ VIEW; DG C-ARM 1-60 MIN - [facility]PT [facility]

[Series 1: run · 3 of 3 slices shown]
[im 1/3]
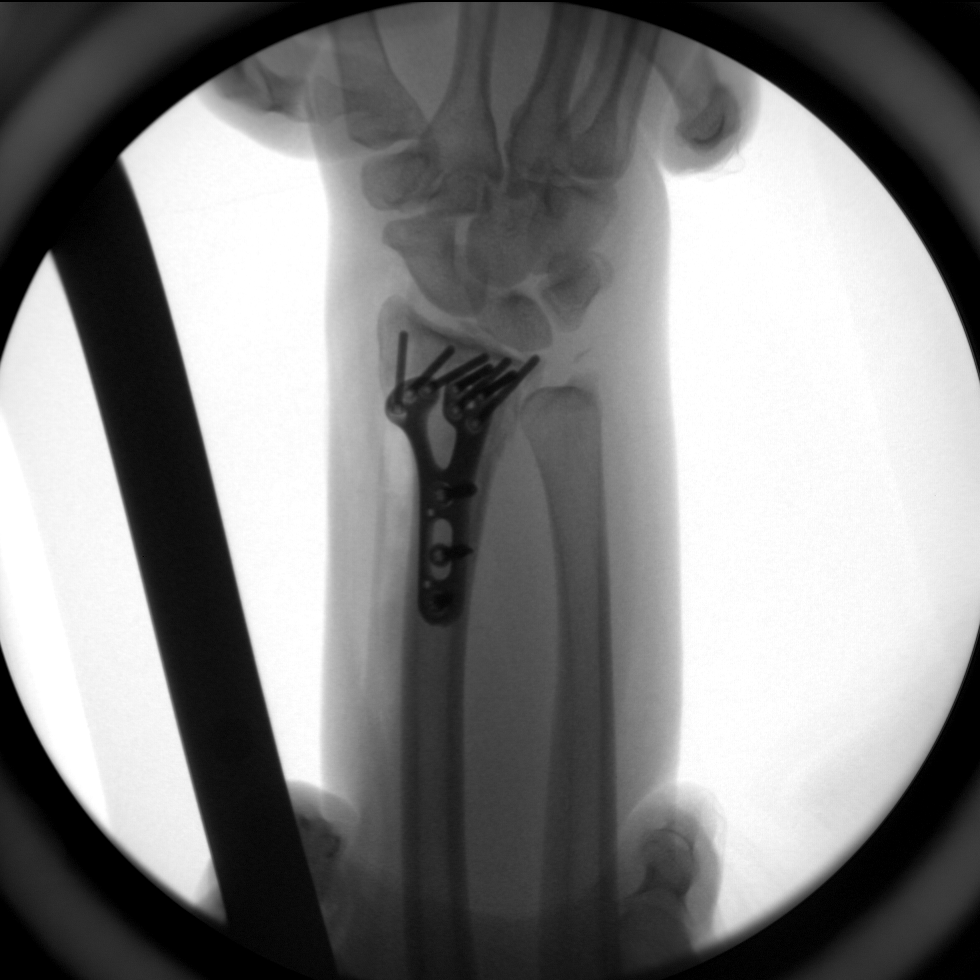
[im 2/3]
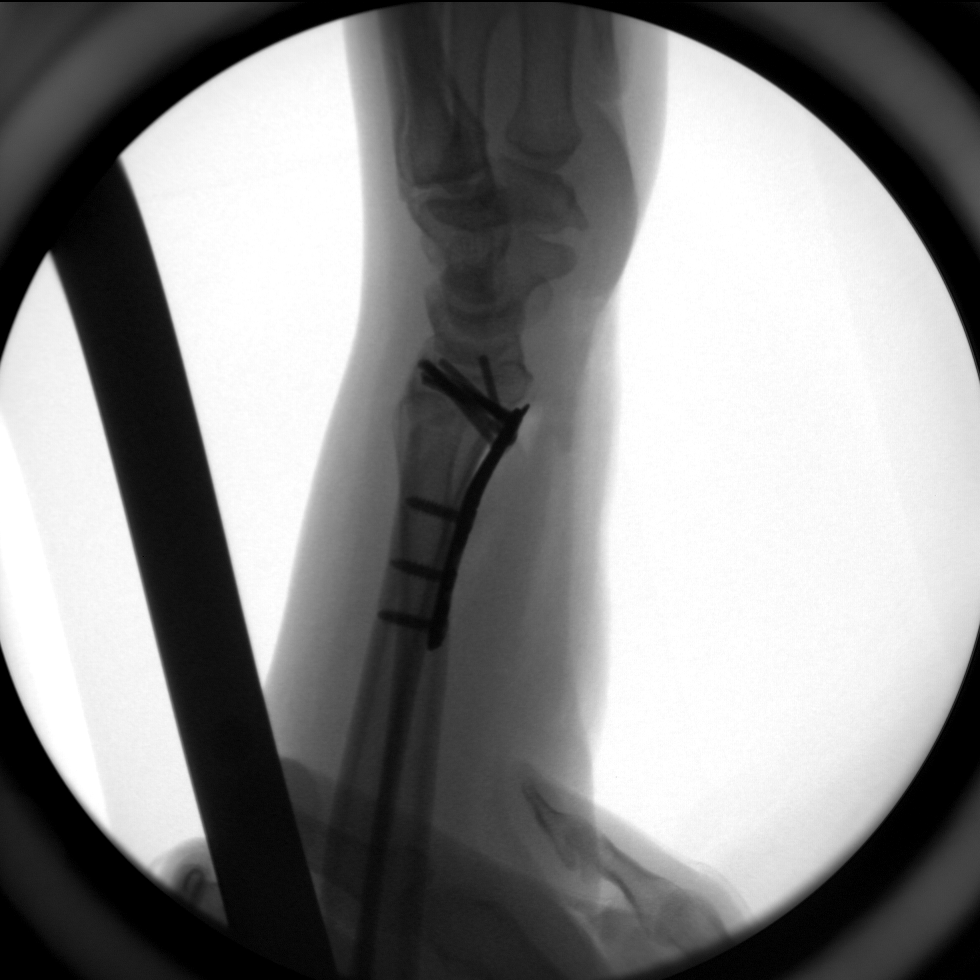
[im 3/3]
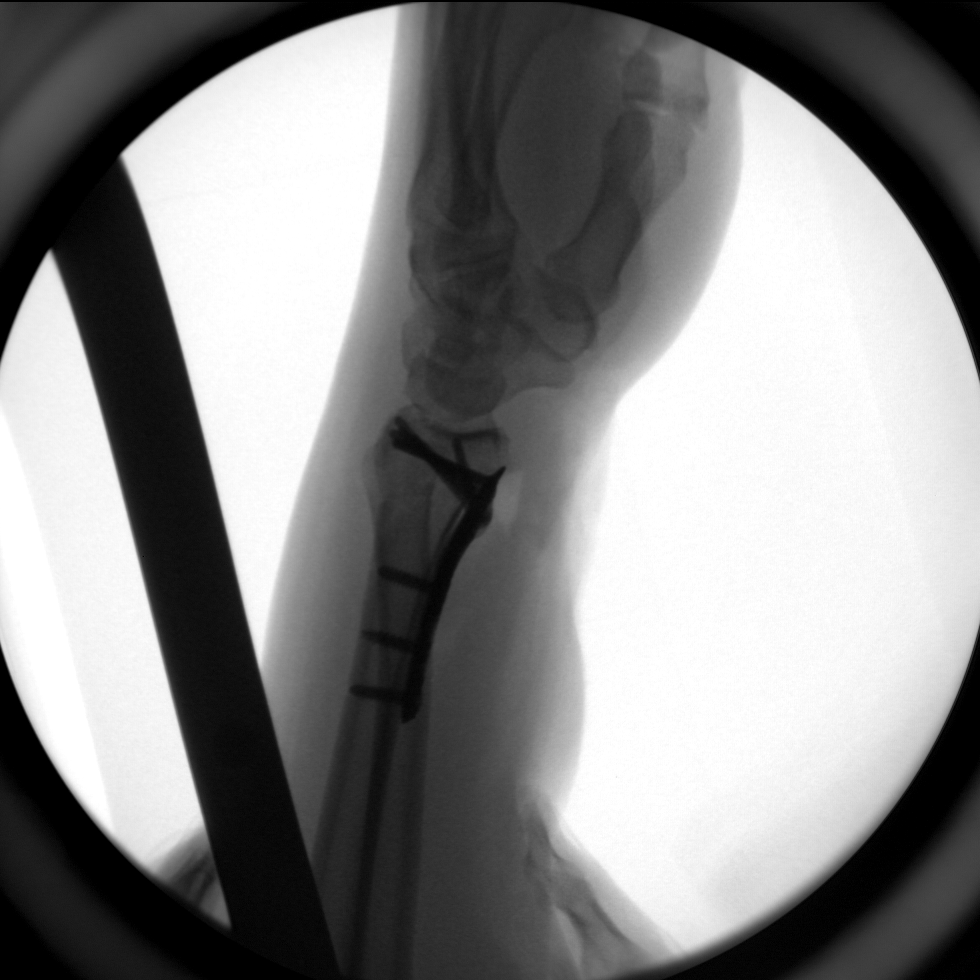

[3 of 3 positions shown; findings below may reference images not displayed]

FINDINGS: Three intra procedural fluoroscopic images of the wrist show
application of a ventral plate for fixation of an intra-articular
distal radius fracture. The most medial and distal screw approaches
the articular surface, but likely remains intraosseous based on
lateral imaging. An ulnar styloid avulsion fracture is noted. Normal
radiocarpal alignment.
IMPRESSION: 1. Fluoroscopy for distal radius fracture ORIF.
2. Ulnar styloid avulsion fracture.
